# Patient Record
Sex: Female | Born: 1975 | Race: Black or African American | Hispanic: No | Marital: Single | Smoking: Current every day smoker
Health system: Southern US, Community
[De-identification: ages and names within clinical notes are randomized; demographics above are authoritative.]

## PROBLEM LIST (undated history)

## (undated) HISTORY — PX: SHOULDER SURGERY: SHX246

---

## 2017-06-02 ENCOUNTER — Encounter (HOSPITAL_BASED_OUTPATIENT_CLINIC_OR_DEPARTMENT_OTHER): Payer: Self-pay | Admitting: Emergency Medicine

## 2017-06-02 ENCOUNTER — Emergency Department (HOSPITAL_BASED_OUTPATIENT_CLINIC_OR_DEPARTMENT_OTHER)
Admission: EM | Admit: 2017-06-02 | Discharge: 2017-06-02 | Disposition: A | Discharge status: Home / Self Care | Payer: Self-pay | Attending: Emergency Medicine | Admitting: Emergency Medicine

## 2017-06-02 ENCOUNTER — Emergency Department (HOSPITAL_BASED_OUTPATIENT_CLINIC_OR_DEPARTMENT_OTHER): Payer: Self-pay

## 2017-06-02 DIAGNOSIS — M25562 Pain in left knee: Secondary | ICD-10-CM | POA: Insufficient documentation

## 2017-06-02 DIAGNOSIS — Y9301 Activity, walking, marching and hiking: Secondary | ICD-10-CM | POA: Insufficient documentation

## 2017-06-02 DIAGNOSIS — W19XXXA Unspecified fall, initial encounter: Secondary | ICD-10-CM

## 2017-06-02 DIAGNOSIS — Y92019 Unspecified place in single-family (private) house as the place of occurrence of the external cause: Secondary | ICD-10-CM | POA: Insufficient documentation

## 2017-06-02 DIAGNOSIS — M79601 Pain in right arm: Secondary | ICD-10-CM | POA: Insufficient documentation

## 2017-06-02 DIAGNOSIS — M25511 Pain in right shoulder: Secondary | ICD-10-CM | POA: Insufficient documentation

## 2017-06-02 DIAGNOSIS — W010XXA Fall on same level from slipping, tripping and stumbling without subsequent striking against object, initial encounter: Secondary | ICD-10-CM | POA: Insufficient documentation

## 2017-06-02 DIAGNOSIS — F17210 Nicotine dependence, cigarettes, uncomplicated: Secondary | ICD-10-CM | POA: Insufficient documentation

## 2017-06-02 DIAGNOSIS — M542 Cervicalgia: Secondary | ICD-10-CM | POA: Insufficient documentation

## 2017-06-02 DIAGNOSIS — R0789 Other chest pain: Secondary | ICD-10-CM | POA: Insufficient documentation

## 2017-06-02 DIAGNOSIS — Y999 Unspecified external cause status: Secondary | ICD-10-CM | POA: Insufficient documentation

## 2017-06-02 MED ORDER — IBUPROFEN 600 MG PO TABS: 600.0000 mg | ORAL_TABLET | Freq: Four times a day (QID) | ORAL | 0 refills | Status: DC | PRN

## 2017-06-02 MED ORDER — ACETAMINOPHEN 500 MG PO TABS: 500.0000 mg | ORAL_TABLET | Freq: Four times a day (QID) | ORAL | 0 refills | Status: AC | PRN

## 2017-06-02 MED ORDER — KETOROLAC TROMETHAMINE 30 MG/ML IJ SOLN: 30.0000 mg | Freq: Once | INTRAMUSCULAR | Status: DC

## 2017-06-02 MED ORDER — KETOROLAC TROMETHAMINE 30 MG/ML IJ SOLN
30.0000 mg | Freq: Once | INTRAMUSCULAR | Status: AC
  Administered 2017-06-02: 30 mg via INTRAMUSCULAR
  Filled 2017-06-02: qty 1

## 2017-06-02 NOTE — ED Notes (Signed)
PMS intact before and after. Pt tolerated well. All questions answered. 

## 2017-06-02 NOTE — ED Notes (Signed)
Upon the beginning of Pt. Assessment Pt. Wanted RN to lift up the R sleeve of her shirt and then said "oh no stop"  RN stopped.  Pt. Said "now you see why"  RN said "no"   Pt. Then said to RN " I had surgery there before and it hurts now".  RN said nothing to Pt. About the surgery and nothing more to Pt. About the R arm only continued to leg Pt. Go on and on with the story of the fall today.  Pt. Has no marks on her clothing or her body that are torn or abraded from the fall the Pt. Has explained to RN.  The Pt. Has given specific details on falling to the R then falling to the L causing injury to her entire body. The only area the Pt. Has not complained about are the feet and toes during the assessment.

## 2017-06-02 NOTE — ED Notes (Signed)
ED Provider at bedside. 

## 2017-06-02 NOTE — ED Triage Notes (Signed)
Patient states that she fell on a wet floor earlier today. She reports that she has a hx of shoulder pain and surgery in the past. Today she fell onto her right side first then when she tried to stand up she fell onto her left side. The patient has pain from her right hip up to her right shoulder and to her left knee and left side.
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
## 2017-06-02 NOTE — Discharge Instructions (Signed)
Alternate 600 mg of ibuprofen and [phone] mg of Tylenol every 3 hours as needed for pain. Do not exceed 4000 mg of Tylenol daily.Use Moist heat therapy by taking a dish rag and soaking in water then ringing out excess water then microwave for 10-15 seconds until hot but not so hot that it would burn and heat to areas of soreness for 15 minutes, multiple times a day. You may also apply ice if this feels better. Do some gentle stretching after hot showers and hot baths to relax muscles. Advance activities as tolerated. Follow-up with a primary care physician for reevaluation if symptoms persist. Return to the ED immediately if any concerning signs or symptoms develop.  You do have a possible nondisplaced rib fracture. Use the incentive spirometer multiple times daily in order to move air through your lungs. Return to the ED immediately if you develop a fever or cough as this may be a sign of a developing pneumonia.

## 2017-06-02 NOTE — ED Notes (Addendum)
Pt. In no distress with no actual noted markings of injury.  Pt. Complains of hurting all over stating she fell today on a wet floor.    Pt. Asked to change into a Gown due to poss. X rays and to further assess the Pt. For any injuries unseen.    Pt. Asked RN several times to remover her bra,RN expressed the Bra can stay in place at this time, RN expressed to Pt. She can change herself and if the Pt. Needs anything more I am here for her.  Pt. Was given warm blankets and a gown.

## 2017-06-02 NOTE — ED Notes (Signed)
Pt. Came out to RN desk at computer and stated in front of RN Lynnell GrainSharon Reynolds to RN Earlene Plateravis " You did not tie my gown up at the bottom in the back" RN Earlene PlaterDavis said to Pt. "well you were lying down". Pt. Said a few more things about showing herself in radiology and RN Earlene PlaterDavis just looked at the Pt.    Pt. Then said she was going out to her car to get her coat because she was cold. Pt. Has 4 blankets in the chair she is in at this time.  RN told the Pt. She will get her another blanket because we do not want anyone going out of the Dept. To their car while they are a Pt.  RN retrieved the Pt. 2 more blankets.  Pt. Asked RN Earlene PlaterDavis at the time of giving the pt. Blankets how long the toradol shot would last RN gave vague time of 4-6 hours.  Also told Pt. Dependant upon the muscle mass of Pt. And each Pt. Differs.

## 2017-06-02 NOTE — ED Notes (Addendum)
Patient transported to X-ray  Warm blankets given

## 2017-06-02 NOTE — ED Notes (Signed)
Pt asking if the medication she was getting has any benadryl in it or narcotic properties? RN explained what Toradol was for. Pt agrees to receiving medication.

## 2017-06-03 ENCOUNTER — Telehealth (HOSPITAL_BASED_OUTPATIENT_CLINIC_OR_DEPARTMENT_OTHER): Payer: Self-pay | Admitting: Emergency Medicine

## 2017-06-03 NOTE — Telephone Encounter (Signed)
Patient returned to ER and retrieved new documents with corrected information as well as CD.

## 2017-06-03 NOTE — Telephone Encounter (Signed)
Patient back to ED-reports concerns with visit from yesterday.  Explained to patient the typical process in ED.  Patient's biggest concern is that her radiology report says she fell at work which is not where the fall occurred.  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  States she may want to take legal action.  Radiology tech called to have radiologist addend note.  States they will notify radiologist.  Patient also requesting CD of xrays.  Explained to patient that these will not be able to be seen on a normal computer.  Patient states she would like to have them for her records.  Fleet Contrasachel, RT notified.  Instructed patient to call back tomorrow to make sure notes have been corrected and that CD has been run with correct report.  Patient voiced understanding.  Patient frustrated at lack of education during visit as well as upon discharge.  Patient thought that x-ray report read that she had a fracture.  Explained to patient that she did not have a fracture and explained to her the terminology that was in her report.  Referral given by A.Law, PA for sports medicine.  Explained to patient if she continued to have pain she needed to follow up with him.  Voiced understanding.  Explained to patient that she does not need to continuously wear sling due to possibility for frozen shoulder. Voiced understanding.  Patient will call and speak to radiology tomorrow regarding report and CD.  Fleet Contrasachel, RT aware.

## 2017-06-10 ENCOUNTER — Ambulatory Visit (INDEPENDENT_AMBULATORY_CARE_PROVIDER_SITE_OTHER): Payer: Self-pay | Admitting: Family Medicine

## 2017-06-10 DIAGNOSIS — M25511 Pain in right shoulder: Secondary | ICD-10-CM

## 2017-06-10 DIAGNOSIS — S4991XA Unspecified injury of right shoulder and upper arm, initial encounter: Secondary | ICD-10-CM

## 2017-06-11 ENCOUNTER — Encounter: Payer: Self-pay | Admitting: Family Medicine

## 2017-06-13 ENCOUNTER — Ambulatory Visit (HOSPITAL_BASED_OUTPATIENT_CLINIC_OR_DEPARTMENT_OTHER)
Admission: RE | Admit: 2017-06-13 | Discharge: 2017-06-13 | Disposition: A | Discharge status: Home / Self Care | Payer: Self-pay | Source: Ambulatory Visit | Attending: Family Medicine | Admitting: Family Medicine

## 2017-06-13 DIAGNOSIS — M25511 Pain in right shoulder: Secondary | ICD-10-CM | POA: Insufficient documentation

## 2017-06-15 DIAGNOSIS — S4991XD Unspecified injury of right shoulder and upper arm, subsequent encounter: Secondary | ICD-10-CM | POA: Insufficient documentation

## 2017-06-15 NOTE — Assessment & Plan Note (Signed)
Independently reviewed shoulder and humerus radiographs - hardware intact and no abnormalities.  Very limited motion and exam.  We discussed conservative treatment with codman exercises vs MRI - went ahead with MRI to assess for rotator cuff tear.  Sling only if needed.  Icing, tylenol, aleve if needed.Independently reviewed shoulder and humerus radiographs - hardware intact and no abnormalities.  Very limited motion and exam.  We discussed conservative treatment with codman exercises vs MRI - went ahead with MRI to assess for rotator cuff tear.  Sling only if needed.  Icing, tylenol, aleve if needed.
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
## 2017-06-19 ENCOUNTER — Telehealth: Payer: Self-pay | Admitting: Family Medicine

## 2017-06-19 NOTE — Telephone Encounter (Signed)
Tracy Mccullough Self [phone]  [name] called to check on the status of her Physical Therapy. Please call her back and let her know where we are at on that.

## 2017-06-22 NOTE — Addendum Note (Signed)
Addended by: Kathi Simpers F on: 06/22/2017 11:41 AM   Modules accepted: Orders

## 2017-06-22 NOTE — Telephone Encounter (Signed)
Patient calling to follow up on physical therapy

## 2017-06-25 NOTE — Telephone Encounter (Signed)
Referral for physical therapy sent

## 2017-07-01 ENCOUNTER — Ambulatory Visit: Payer: Self-pay | Admitting: Physical Therapy

## 2017-07-02 ENCOUNTER — Telehealth: Payer: Self-pay | Admitting: Family Medicine

## 2017-07-02 NOTE — Telephone Encounter (Signed)
Patient would like to be reexamined, scheduled her for Tuesday at 2pm
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
## 2017-07-02 NOTE — Telephone Encounter (Signed)
Patient has been [facility]

## 2017-07-02 NOTE — Telephone Encounter (Signed)
Her hardware looks excellent on x-rays and the MRI.  If she would like me to reexamine her I can see her sooner - either tomorrow or Tuesday.

## 2017-07-07 ENCOUNTER — Ambulatory Visit: Payer: Self-pay | Admitting: Family Medicine

## 2017-07-07 ENCOUNTER — Ambulatory Visit: Payer: Self-pay

## 2017-07-09 ENCOUNTER — Ambulatory Visit: Payer: Self-pay | Attending: Family Medicine | Admitting: Physical Therapy

## 2017-07-09 DIAGNOSIS — M25511 Pain in right shoulder: Secondary | ICD-10-CM | POA: Insufficient documentation

## 2017-07-09 DIAGNOSIS — R252 Cramp and spasm: Secondary | ICD-10-CM | POA: Insufficient documentation

## 2017-07-09 DIAGNOSIS — M6281 Muscle weakness (generalized): Secondary | ICD-10-CM | POA: Insufficient documentation

## 2017-07-09 DIAGNOSIS — R293 Abnormal posture: Secondary | ICD-10-CM | POA: Insufficient documentation

## 2017-07-09 DIAGNOSIS — M25611 Stiffness of right shoulder, not elsewhere classified: Secondary | ICD-10-CM | POA: Insufficient documentation
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
## 2017-07-13 ENCOUNTER — Ambulatory Visit: Payer: Self-pay | Admitting: Physical Therapy

## 2017-07-14 ENCOUNTER — Ambulatory Visit: Payer: Self-pay | Admitting: Physical Therapy

## 2017-07-15 ENCOUNTER — Ambulatory Visit: Payer: Self-pay | Admitting: Family Medicine

## 2017-07-15 ENCOUNTER — Ambulatory Visit: Payer: Self-pay | Admitting: Physical Therapy

## 2017-07-16 ENCOUNTER — Ambulatory Visit: Payer: Self-pay | Admitting: Family Medicine

## 2017-07-20 ENCOUNTER — Ambulatory Visit: Payer: Self-pay | Admitting: Physical Therapy

## 2017-07-22 ENCOUNTER — Ambulatory Visit: Payer: Self-pay | Admitting: Physical Therapy

## 2017-07-22 DIAGNOSIS — M25611 Stiffness of right shoulder, not elsewhere classified: Secondary | ICD-10-CM

## 2017-07-22 DIAGNOSIS — R293 Abnormal posture: Secondary | ICD-10-CM

## 2017-07-22 DIAGNOSIS — M25511 Pain in right shoulder: Secondary | ICD-10-CM

## 2017-07-22 DIAGNOSIS — M6281 Muscle weakness (generalized): Secondary | ICD-10-CM

## 2017-07-22 DIAGNOSIS — R252 Cramp and spasm: Secondary | ICD-10-CM
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
## 2017-07-24 ENCOUNTER — Encounter: Payer: Self-pay | Admitting: Family Medicine

## 2017-07-24 ENCOUNTER — Ambulatory Visit (INDEPENDENT_AMBULATORY_CARE_PROVIDER_SITE_OTHER): Payer: Self-pay | Admitting: Family Medicine

## 2017-07-24 DIAGNOSIS — S4991XD Unspecified injury of right shoulder and upper arm, subsequent encounter: Secondary | ICD-10-CM

## 2017-07-24 MED ORDER — IBUPROFEN 600 MG PO TABS: 600.0000 mg | ORAL_TABLET | Freq: Four times a day (QID) | ORAL | 1 refills | Status: AC | PRN

## 2017-07-24 NOTE — Patient Instructions (Addendum)
Your current pain is due to muscle spasms/strain of back, rotator cuff, upper arm muscles. Stop using your sling. Motrin  three times a day with food for pain and inflammation. It's ok to take tylenol in addition to this. Therapy is the most important part of your treatment - do home exercises on days you don't go to therapy. You will need to push through some pain to fully recover. They only will schedule you for 1 visit at a time though because you've missed a couple appointments. A CT arthrogram is an option if you're not improving as we discussed. Follow up with me in 6 weeks for reevaluation.

## 2017-07-27 ENCOUNTER — Ambulatory Visit: Payer: Self-pay | Admitting: Physical Therapy

## 2017-07-28 NOTE — Assessment & Plan Note (Signed)
Radiographs of shoulder and humerus reassuring.  Her MRI without evidence abnormalities as well.  She continues to guard on exam and use sling.  Has only been to 2 visits of PT but has missed/canceled 3.  Advised her to stop using the sling and push through some pain in PT to aid in relief from the muscle spasms/strain she's having of multiple groups around the shoulder.  Motrin three times a day.  Consider CT arthrogram if not improving.  F/u in 6 weeks.
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
## 2017-07-29 ENCOUNTER — Ambulatory Visit: Payer: Self-pay | Admitting: Physical Therapy

## 2017-07-30 ENCOUNTER — Ambulatory Visit: Payer: Self-pay | Admitting: Physical Therapy

## 2017-07-30 DIAGNOSIS — R252 Cramp and spasm: Secondary | ICD-10-CM

## 2017-07-30 DIAGNOSIS — M6281 Muscle weakness (generalized): Secondary | ICD-10-CM

## 2017-07-30 DIAGNOSIS — M25511 Pain in right shoulder: Secondary | ICD-10-CM

## 2017-07-30 DIAGNOSIS — R293 Abnormal posture: Secondary | ICD-10-CM

## 2017-07-30 DIAGNOSIS — M25611 Stiffness of right shoulder, not elsewhere classified: Secondary | ICD-10-CM
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
## 2017-08-03 ENCOUNTER — Ambulatory Visit: Payer: Self-pay | Admitting: Physical Therapy

## 2017-08-05 ENCOUNTER — Ambulatory Visit: Payer: Self-pay | Attending: Family Medicine | Admitting: Physical Therapy

## 2017-08-05 ENCOUNTER — Ambulatory Visit: Payer: Self-pay | Admitting: Physical Therapy

## 2017-09-04 ENCOUNTER — Ambulatory Visit: Payer: Self-pay | Admitting: Family Medicine

## 2018-10-20 IMAGING — DX DG HAND COMPLETE 3+V*R*
3 series · 3 of 3 positions shown · non-contrast
Comparison: Right forearm series today reported separately.

ADDENDUM:
Correction, the patient's fall injury occurred at home.
CLINICAL DATA: 40-year-old female status post slip and fall at work
on wet floor. Right upper extremity pain, right rib, and left knee
pain. Prior right humerus surgery at age 16.

EXAM:
RIGHT HAND - COMPLETE 3+ VIEW

[hand pa]
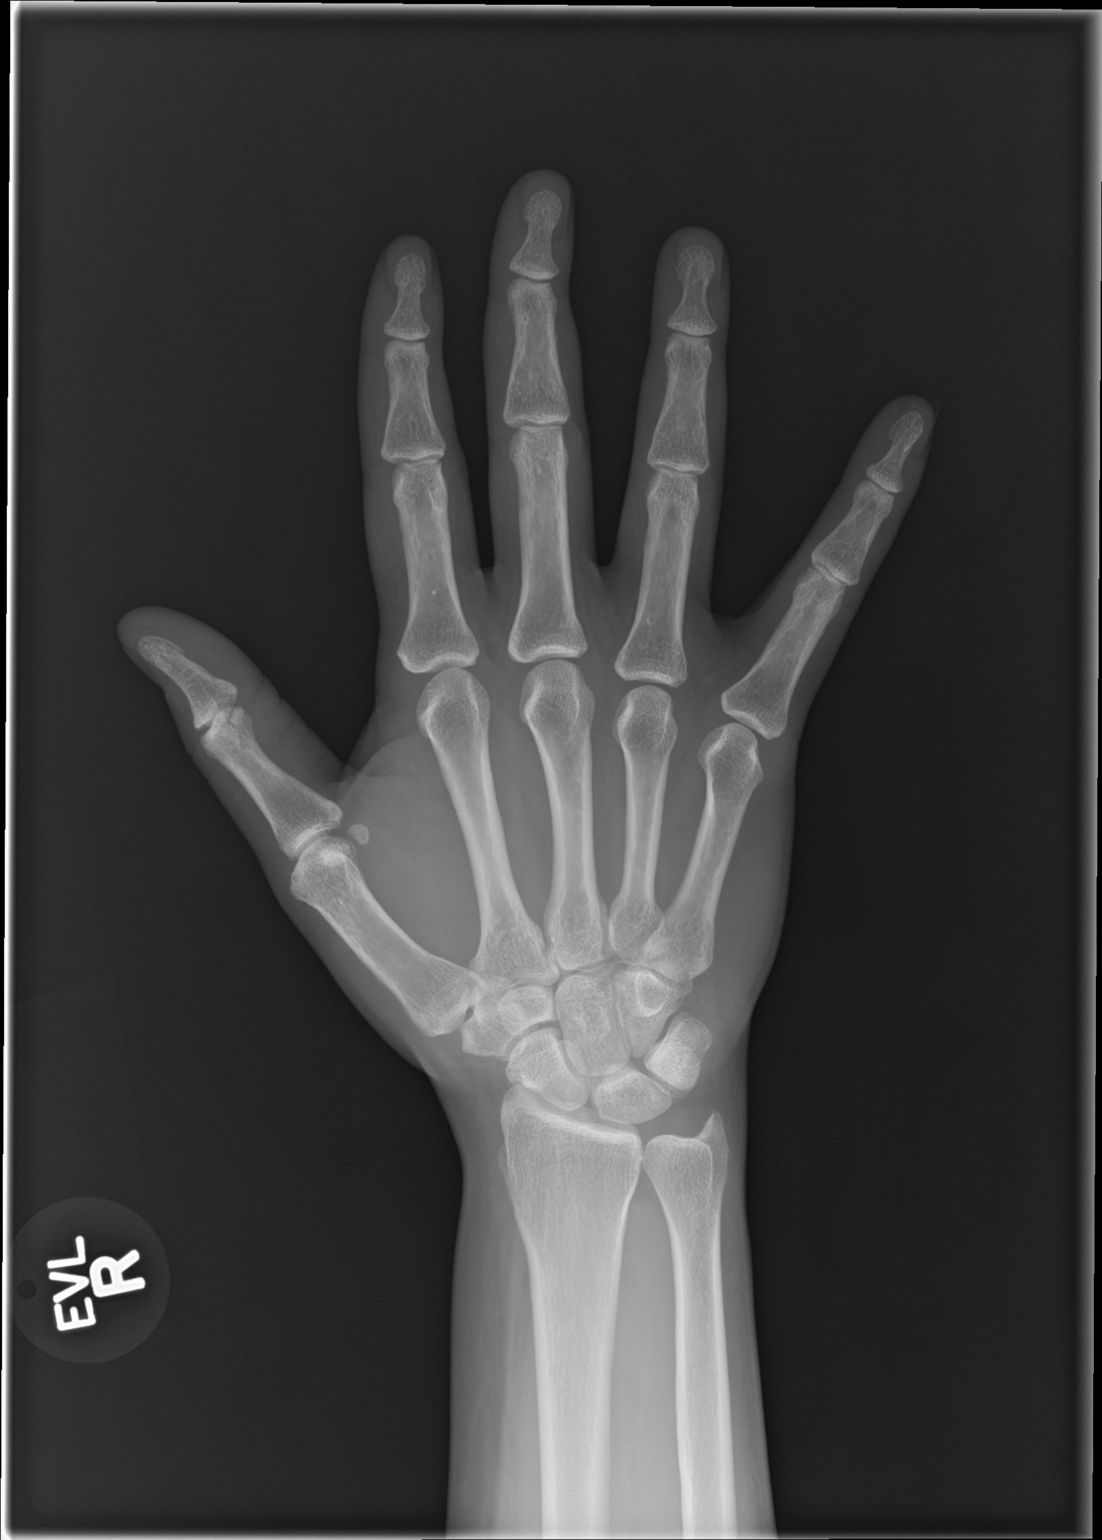

[hand obl]
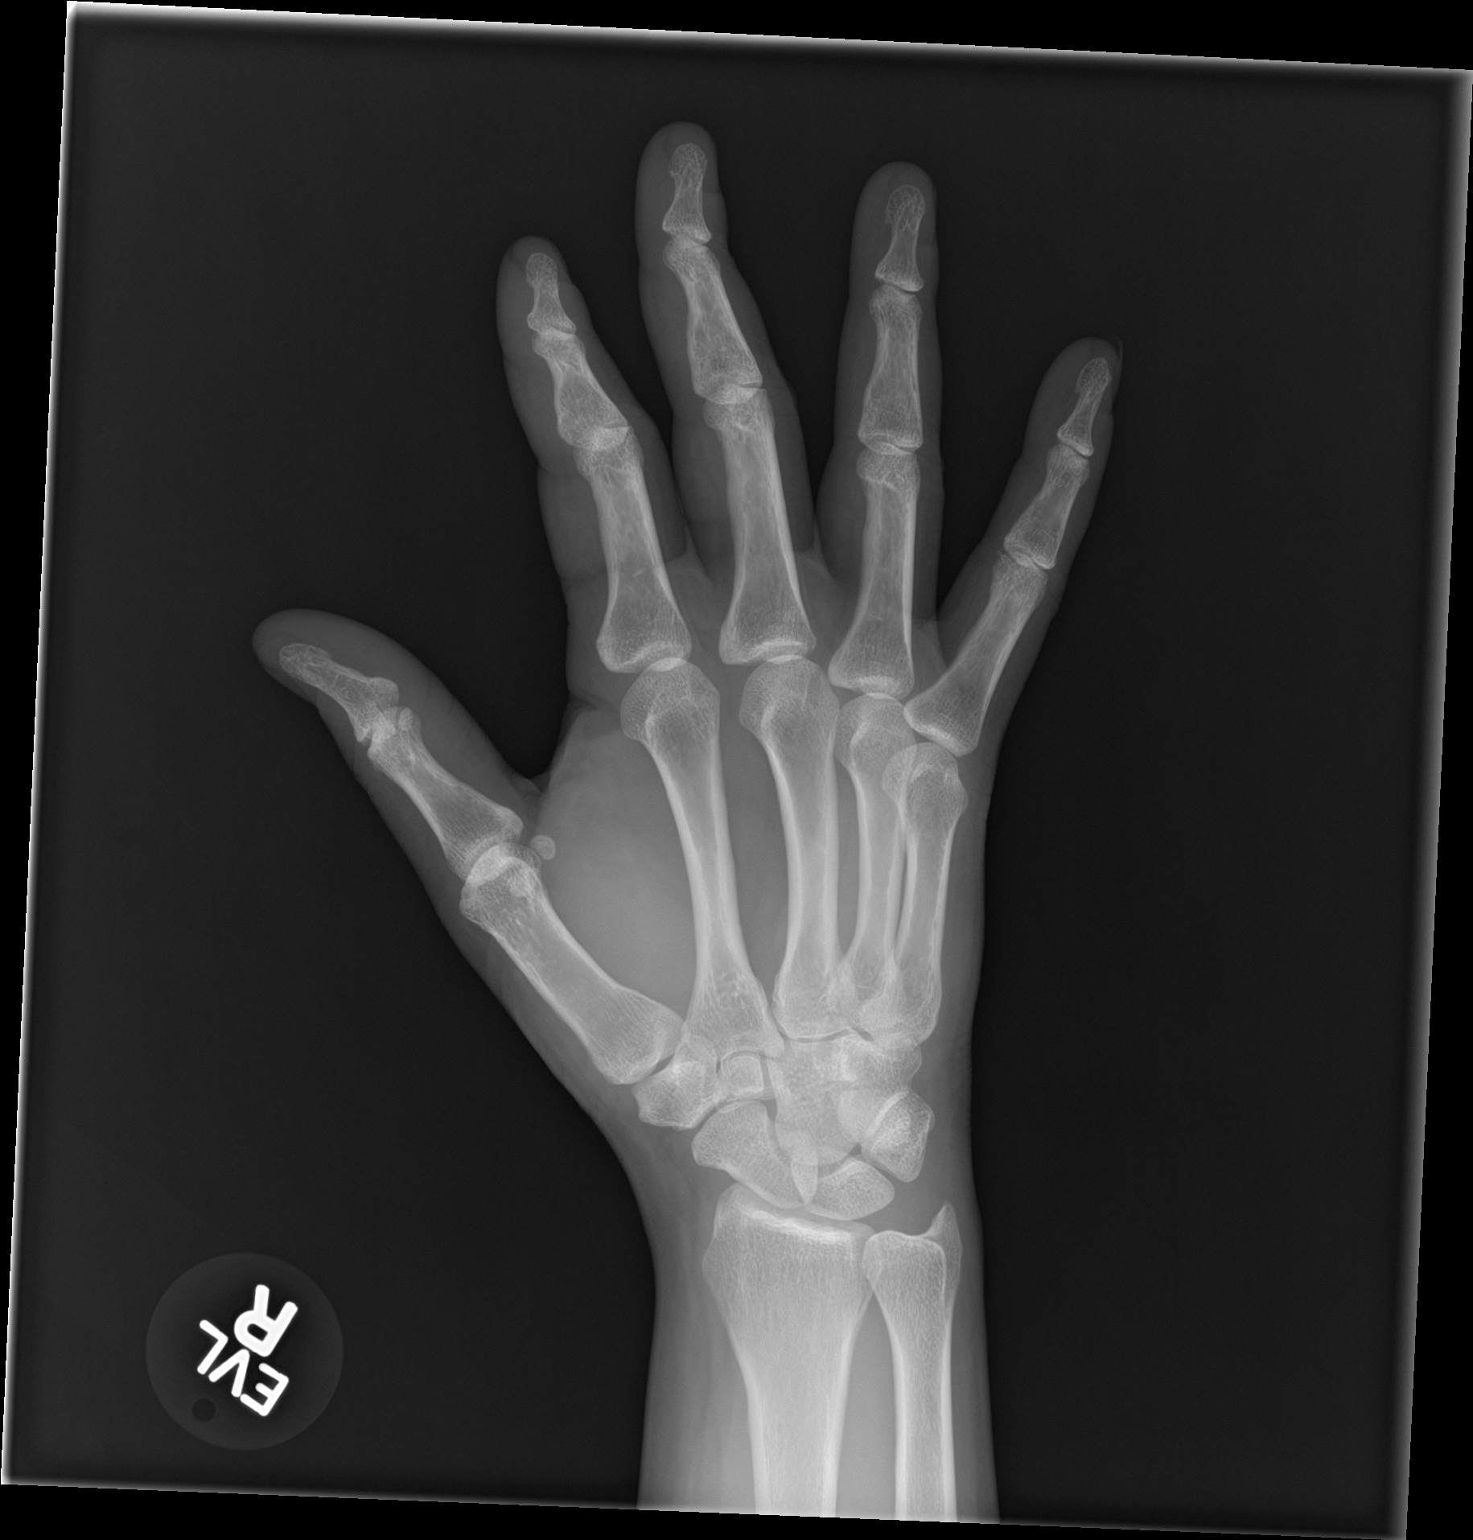

[hand lat]
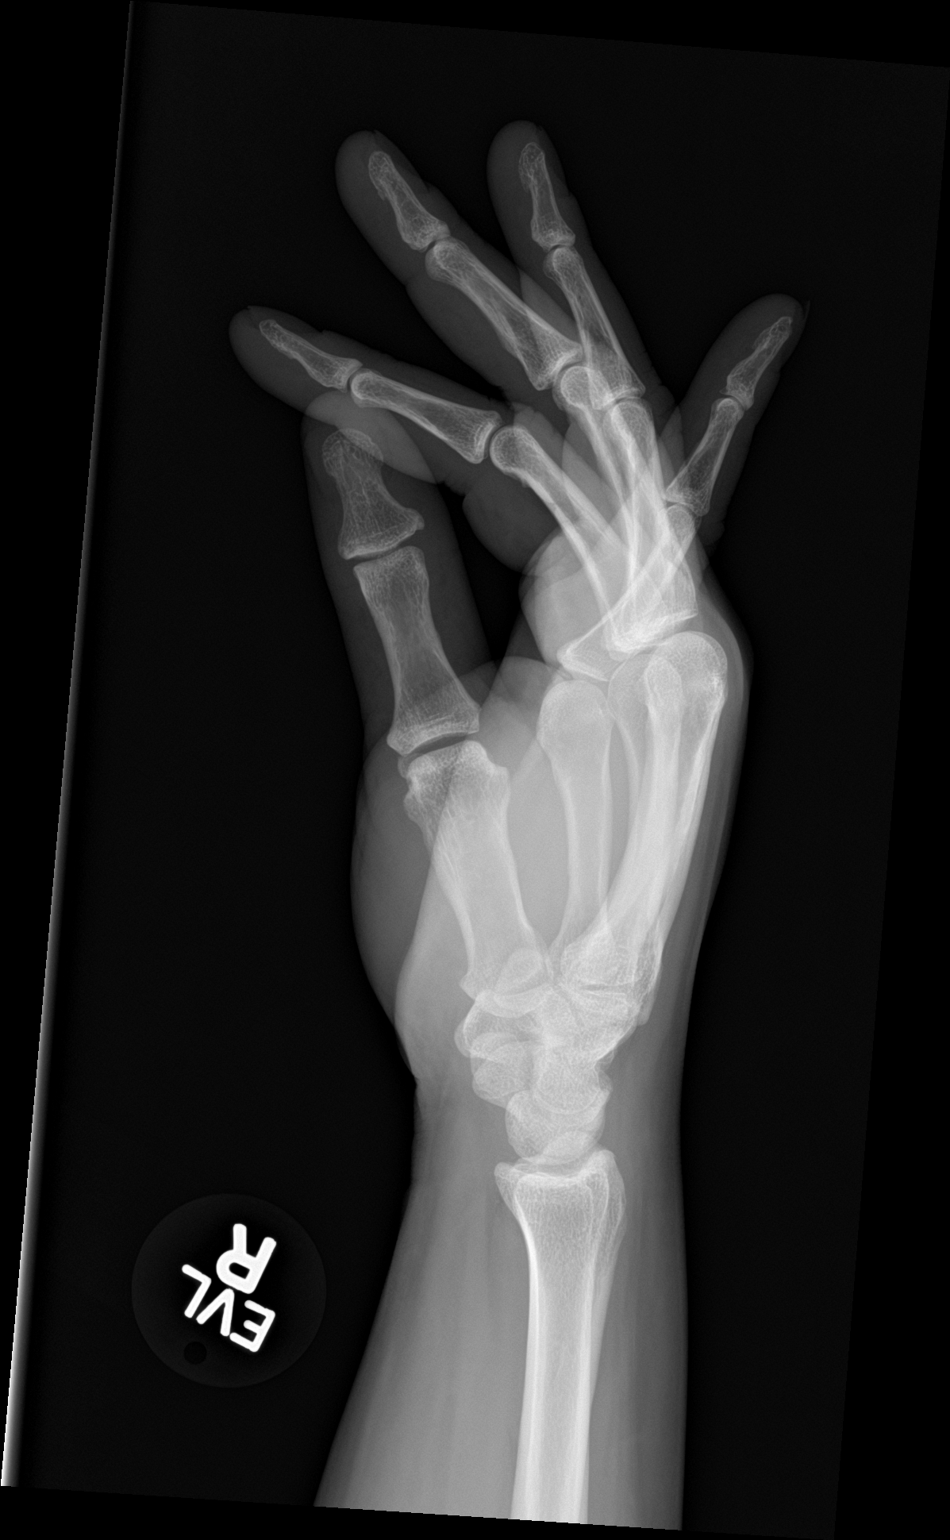

[3 of 3 positions shown; findings below may reference images not displayed]

FINDINGS: Distal right radius and ulna appear intact. Carpal bone alignment
and joint spaces within normal limits. Metacarpals and phalanges are
intact. Joint spaces are within normal limits.
IMPRESSION: Normal radiographic appearance of the right hand.
